# Patient Record
Sex: Male | Born: 1975 | Race: White | Hispanic: No | Marital: Married | Smoking: Former smoker
Health system: Southern US, Community
[De-identification: ages and names within clinical notes are randomized; demographics above are authoritative.]

## PROBLEM LIST (undated history)

## (undated) DIAGNOSIS — R51 Headache: Secondary | ICD-10-CM

## (undated) DIAGNOSIS — R519 Headache, unspecified: Secondary | ICD-10-CM

## (undated) DIAGNOSIS — T4145XA Adverse effect of unspecified anesthetic, initial encounter: Secondary | ICD-10-CM

## (undated) DIAGNOSIS — K4090 Unilateral inguinal hernia, without obstruction or gangrene, not specified as recurrent: Secondary | ICD-10-CM

## (undated) DIAGNOSIS — T8859XA Other complications of anesthesia, initial encounter: Secondary | ICD-10-CM

---
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
## 1998-01-04 HISTORY — PX: HERNIA REPAIR: SHX51

## 2016-08-18 ENCOUNTER — Other Ambulatory Visit: Payer: Self-pay | Admitting: General Surgery

## 2016-08-18 DIAGNOSIS — R1032 Left lower quadrant pain: Secondary | ICD-10-CM

## 2016-08-24 ENCOUNTER — Other Ambulatory Visit: Payer: Self-pay

## 2016-08-26 ENCOUNTER — Ambulatory Visit
Admission: RE | Admit: 2016-08-26 | Discharge: 2016-08-26 | Disposition: A | Discharge status: Home / Self Care | Payer: BLUE CROSS/BLUE SHIELD | Source: Ambulatory Visit | Attending: General Surgery | Admitting: General Surgery

## 2016-08-26 DIAGNOSIS — R1032 Left lower quadrant pain: Secondary | ICD-10-CM

## 2016-08-26 MED ORDER — IOPAMIDOL (ISOVUE-300) INJECTION 61%
125.0000 mL | Freq: Once | INTRAVENOUS | Status: AC | PRN
  Administered 2016-08-26: 125 mL via INTRAVENOUS

## 2016-09-04 HISTORY — PX: OTHER SURGICAL HISTORY: SHX169

## 2016-09-17 ENCOUNTER — Ambulatory Visit: Payer: Self-pay | Admitting: General Surgery

## 2016-10-13 ENCOUNTER — Encounter (HOSPITAL_BASED_OUTPATIENT_CLINIC_OR_DEPARTMENT_OTHER): Payer: Self-pay | Admitting: *Deleted

## 2016-10-13 NOTE — Progress Notes (Signed)
Npo after midnight arrive 800 10-21-16 [facility], wife Alex Hicks driver.

## 2016-10-18 DIAGNOSIS — K4090 Unilateral inguinal hernia, without obstruction or gangrene, not specified as recurrent: Secondary | ICD-10-CM | POA: Diagnosis not present

## 2016-10-18 DIAGNOSIS — Z87891 Personal history of nicotine dependence: Secondary | ICD-10-CM | POA: Diagnosis not present

## 2016-10-18 DIAGNOSIS — K4091 Unilateral inguinal hernia, without obstruction or gangrene, recurrent: Secondary | ICD-10-CM | POA: Diagnosis present

## 2016-10-18 LAB — BASIC METABOLIC PANEL
Anion gap: 10 (ref 5–15)
BUN: 13 mg/dL (ref 6–20)
CHLORIDE: 102 mmol/L (ref 101–111)
CO2: 27 mmol/L (ref 22–32)
Calcium: 9.6 mg/dL (ref 8.9–10.3)
Creatinine, Ser: 0.97 mg/dL (ref 0.61–1.24)
GFR calc non Af Amer: >60 mL/min (ref >60)
Glucose, Bld: 101 mg/dL — ABNORMAL HIGH (ref 65–99)
POTASSIUM: 4.5 mmol/L (ref 3.5–5.1)
SODIUM: 139 mmol/L (ref 135–145)

## 2016-10-18 LAB — CBC WITH DIFFERENTIAL/PLATELET
Basophils Absolute: 0 10*3/uL (ref 0.0–0.1)
Basophils Relative: 1 %
Eosinophils Absolute: 0.2 10*3/uL (ref 0.0–0.7)
Eosinophils Relative: 4 %
HEMATOCRIT: 49.8 % (ref 39.0–52.0)
HEMOGLOBIN: 17.5 g/dL — AB (ref 13.0–17.0)
LYMPHS ABS: 2.6 10*3/uL (ref 0.7–4.0)
LYMPHS PCT: 39 %
MCH: 32.6 pg (ref 26.0–34.0)
MCHC: 35.1 g/dL (ref 30.0–36.0)
MCV: 92.9 fL (ref 78.0–100.0)
Monocytes Absolute: 0.6 10*3/uL (ref 0.1–1.0)
Monocytes Relative: 9 %
NEUTROS ABS: 3.2 10*3/uL (ref 1.7–7.7)
NEUTROS PCT: 47 %
Platelets: 227 10*3/uL (ref 150–400)
RBC: 5.36 MIL/uL (ref 4.22–5.81)
RDW: 12.6 % (ref 11.5–15.5)
WBC: 6.7 10*3/uL (ref 4.0–10.5)

## 2016-10-21 ENCOUNTER — Ambulatory Visit (HOSPITAL_BASED_OUTPATIENT_CLINIC_OR_DEPARTMENT_OTHER): Payer: BLUE CROSS/BLUE SHIELD | Admitting: Anesthesiology

## 2016-10-21 ENCOUNTER — Ambulatory Visit (HOSPITAL_BASED_OUTPATIENT_CLINIC_OR_DEPARTMENT_OTHER)
Admission: RE | Admit: 2016-10-21 | Discharge: 2016-10-21 | Disposition: A | Discharge status: Home / Self Care | Payer: BLUE CROSS/BLUE SHIELD | Source: Ambulatory Visit | Attending: General Surgery | Admitting: General Surgery

## 2016-10-21 ENCOUNTER — Encounter (HOSPITAL_BASED_OUTPATIENT_CLINIC_OR_DEPARTMENT_OTHER)
Admission: RE | Disposition: A | Discharge status: Home / Self Care | Payer: Self-pay | Source: Ambulatory Visit | Attending: General Surgery

## 2016-10-21 ENCOUNTER — Encounter (HOSPITAL_BASED_OUTPATIENT_CLINIC_OR_DEPARTMENT_OTHER): Payer: Self-pay | Admitting: Anesthesiology

## 2016-10-21 DIAGNOSIS — Z87891 Personal history of nicotine dependence: Secondary | ICD-10-CM | POA: Insufficient documentation

## 2016-10-21 DIAGNOSIS — K4091 Unilateral inguinal hernia, without obstruction or gangrene, recurrent: Secondary | ICD-10-CM | POA: Diagnosis not present

## 2016-10-21 DIAGNOSIS — K4090 Unilateral inguinal hernia, without obstruction or gangrene, not specified as recurrent: Secondary | ICD-10-CM | POA: Insufficient documentation

## 2016-10-21 HISTORY — DX: Headache, unspecified: R51.9

## 2016-10-21 HISTORY — PX: INSERTION OF MESH: SHX5868

## 2016-10-21 HISTORY — DX: Unilateral inguinal hernia, without obstruction or gangrene, not specified as recurrent: K40.90

## 2016-10-21 HISTORY — DX: Adverse effect of unspecified anesthetic, initial encounter: T41.45XA

## 2016-10-21 HISTORY — DX: Other complications of anesthesia, initial encounter: T88.59XA

## 2016-10-21 HISTORY — DX: Headache: R51

## 2016-10-21 HISTORY — PX: INGUINAL HERNIA REPAIR: SHX194

## 2016-10-21 SURGERY — REPAIR, HERNIA, INGUINAL, LAPAROSCOPIC
Anesthesia: General

## 2016-10-21 MED ORDER — DEXAMETHASONE SODIUM PHOSPHATE 10 MG/ML IJ SOLN
INTRAMUSCULAR | Status: AC
  Filled 2016-10-21: qty 1

## 2016-10-21 MED ORDER — SUGAMMADEX SODIUM 200 MG/2ML IV SOLN
INTRAVENOUS | Status: AC
  Filled 2016-10-21: qty 2

## 2016-10-21 MED ORDER — ONDANSETRON HCL 4 MG/2ML IJ SOLN
4.0000 mg | Freq: Once | INTRAMUSCULAR | Status: AC | PRN
  Administered 2016-10-21: 4 mg via INTRAVENOUS
  Filled 2016-10-21: qty 2

## 2016-10-21 MED ORDER — PROPOFOL 10 MG/ML IV BOLUS
INTRAVENOUS | Status: AC
Start: 2016-10-21 — End: ?
  Filled 2016-10-21: qty 20

## 2016-10-21 MED ORDER — CEFAZOLIN SODIUM-DEXTROSE 2-4 GM/100ML-% IV SOLN
INTRAVENOUS | Status: AC
  Filled 2016-10-21: qty 100

## 2016-10-21 MED ORDER — SUGAMMADEX SODIUM 200 MG/2ML IV SOLN
INTRAVENOUS | Status: DC | PRN
  Administered 2016-10-21: 200 mg via INTRAVENOUS

## 2016-10-21 MED ORDER — FENTANYL CITRATE (PF) 100 MCG/2ML IJ SOLN
INTRAMUSCULAR | Status: DC | PRN
  Administered 2016-10-21 (×2): 25 ug via INTRAVENOUS
  Administered 2016-10-21: 50 ug via INTRAVENOUS
  Administered 2016-10-21 (×4): 25 ug via INTRAVENOUS
  Administered 2016-10-21 (×2): 50 ug via INTRAVENOUS

## 2016-10-21 MED ORDER — MIDAZOLAM HCL 5 MG/5ML IJ SOLN
INTRAMUSCULAR | Status: DC | PRN
  Administered 2016-10-21: 2 mg via INTRAVENOUS

## 2016-10-21 MED ORDER — ACETAMINOPHEN 500 MG PO TABS
ORAL_TABLET | ORAL | Status: AC
Start: 2016-10-21 — End: ?
  Filled 2016-10-21: qty 2

## 2016-10-21 MED ORDER — ONDANSETRON HCL 4 MG/2ML IJ SOLN
INTRAMUSCULAR | Status: DC | PRN
  Administered 2016-10-21: 4 mg via INTRAVENOUS

## 2016-10-21 MED ORDER — CHLORHEXIDINE GLUCONATE CLOTH 2 % EX PADS
6.0000 | MEDICATED_PAD | Freq: Once | CUTANEOUS | Status: DC
  Filled 2016-10-21: qty 6

## 2016-10-21 MED ORDER — ACETAMINOPHEN 500 MG PO TABS
1000.0000 mg | ORAL_TABLET | ORAL | Status: AC
  Administered 2016-10-21: 1000 mg via ORAL
  Filled 2016-10-21: qty 2

## 2016-10-21 MED ORDER — FENTANYL CITRATE (PF) 100 MCG/2ML IJ SOLN
INTRAMUSCULAR | Status: AC
  Filled 2016-10-21: qty 2

## 2016-10-21 MED ORDER — ONDANSETRON HCL 4 MG/2ML IJ SOLN
INTRAMUSCULAR | Status: AC
Start: 2016-10-21 — End: ?
  Filled 2016-10-21: qty 2

## 2016-10-21 MED ORDER — ROCURONIUM BROMIDE 100 MG/10ML IV SOLN
INTRAVENOUS | Status: DC | PRN
  Administered 2016-10-21 (×3): 10 mg via INTRAVENOUS
  Administered 2016-10-21: 50 mg via INTRAVENOUS

## 2016-10-21 MED ORDER — GABAPENTIN 300 MG PO CAPS
ORAL_CAPSULE | ORAL | Status: AC
  Filled 2016-10-21: qty 1

## 2016-10-21 MED ORDER — MEPERIDINE HCL 25 MG/ML IJ SOLN
6.2500 mg | INTRAMUSCULAR | Status: DC | PRN
  Filled 2016-10-21: qty 1

## 2016-10-21 MED ORDER — LACTATED RINGERS IV SOLN
INTRAVENOUS | Status: DC
  Administered 2016-10-21 (×3): via INTRAVENOUS
  Filled 2016-10-21: qty 1000

## 2016-10-21 MED ORDER — FENTANYL CITRATE (PF) 100 MCG/2ML IJ SOLN
25.0000 ug | INTRAMUSCULAR | Status: DC | PRN
  Filled 2016-10-21: qty 1

## 2016-10-21 MED ORDER — CELECOXIB 200 MG PO CAPS
ORAL_CAPSULE | ORAL | Status: AC
  Filled 2016-10-21: qty 1

## 2016-10-21 MED ORDER — MIDAZOLAM HCL 2 MG/2ML IJ SOLN
INTRAMUSCULAR | Status: AC
  Filled 2016-10-21: qty 2

## 2016-10-21 MED ORDER — ROCURONIUM BROMIDE 50 MG/5ML IV SOSY
PREFILLED_SYRINGE | INTRAVENOUS | Status: AC
  Filled 2016-10-21: qty 5

## 2016-10-21 MED ORDER — CEFAZOLIN SODIUM-DEXTROSE 2-4 GM/100ML-% IV SOLN
2.0000 g | INTRAVENOUS | Status: AC
  Administered 2016-10-21: 2 g via INTRAVENOUS
  Filled 2016-10-21: qty 100

## 2016-10-21 MED ORDER — HYDROCODONE-ACETAMINOPHEN 5-325 MG PO TABS: 1.0000 | ORAL_TABLET | Freq: Four times a day (QID) | ORAL | 0 refills | Status: AC | PRN

## 2016-10-21 MED ORDER — PROMETHAZINE HCL 25 MG/ML IJ SOLN
INTRAMUSCULAR | Status: AC
  Filled 2016-10-21: qty 1

## 2016-10-21 MED ORDER — GABAPENTIN 300 MG PO CAPS
300.0000 mg | ORAL_CAPSULE | ORAL | Status: AC
  Administered 2016-10-21: 300 mg via ORAL
  Filled 2016-10-21: qty 1

## 2016-10-21 MED ORDER — LIDOCAINE HCL (CARDIAC) 20 MG/ML IV SOLN
INTRAVENOUS | Status: DC | PRN
  Administered 2016-10-21: 100 mg via INTRAVENOUS

## 2016-10-21 MED ORDER — DEXAMETHASONE SODIUM PHOSPHATE 4 MG/ML IJ SOLN
INTRAMUSCULAR | Status: DC | PRN
  Administered 2016-10-21: 10 mg via INTRAVENOUS

## 2016-10-21 MED ORDER — LIDOCAINE 2% (20 MG/ML) 5 ML SYRINGE
INTRAMUSCULAR | Status: AC
  Filled 2016-10-21: qty 5

## 2016-10-21 MED ORDER — BUPIVACAINE HCL 0.5 % IJ SOLN
INTRAMUSCULAR | Status: DC | PRN
  Administered 2016-10-21: 30 mL

## 2016-10-21 MED ORDER — BUPIVACAINE LIPOSOME 1.3 % IJ SUSP
INTRAMUSCULAR | Status: DC | PRN
  Administered 2016-10-21: 20 mL

## 2016-10-21 MED ORDER — KETOROLAC TROMETHAMINE 30 MG/ML IJ SOLN
INTRAMUSCULAR | Status: AC
  Filled 2016-10-21: qty 1

## 2016-10-21 MED ORDER — PROMETHAZINE HCL 25 MG/ML IJ SOLN
6.2500 mg | INTRAMUSCULAR | Status: DC | PRN
Start: 2016-10-21 — End: 2016-10-21
  Administered 2016-10-21: 6.25 mg via INTRAVENOUS
  Filled 2016-10-21: qty 1

## 2016-10-21 MED ORDER — IBUPROFEN 800 MG PO TABS: 800.0000 mg | ORAL_TABLET | Freq: Three times a day (TID) | ORAL | 0 refills | Status: AC | PRN

## 2016-10-21 MED ORDER — CELECOXIB 200 MG PO CAPS
200.0000 mg | ORAL_CAPSULE | ORAL | Status: AC
  Administered 2016-10-21: 200 mg via ORAL
  Filled 2016-10-21: qty 1

## 2016-10-21 MED ORDER — PROPOFOL 10 MG/ML IV BOLUS
INTRAVENOUS | Status: DC | PRN
  Administered 2016-10-21: 100 mg via INTRAVENOUS
  Administered 2016-10-21 (×2): 50 mg via INTRAVENOUS
  Administered 2016-10-21: 200 mg via INTRAVENOUS

## 2016-10-21 SURGICAL SUPPLY — 40 items
APPLICATOR COTTON TIP 6IN STRL (MISCELLANEOUS) ×3 IMPLANT
BLADE CLIPPER SENSICLIP SURGIC (BLADE) ×3 IMPLANT
BLADE SURG 11 STRL SS (BLADE) ×3 IMPLANT
CABLE HIGH FREQUENCY MONO STRZ (ELECTRODE) ×3 IMPLANT
CHLORAPREP W/TINT 26ML (MISCELLANEOUS) ×3 IMPLANT
COVER BACK TABLE 60X90IN (DRAPES) ×3 IMPLANT
COVER MAYO STAND STRL (DRAPES) ×3 IMPLANT
DECANTER SPIKE VIAL GLASS SM (MISCELLANEOUS) IMPLANT
DERMABOND ADVANCED (GAUZE/BANDAGES/DRESSINGS) ×6
DERMABOND ADVANCED .7 DNX12 (GAUZE/BANDAGES/DRESSINGS) ×3 IMPLANT
DEVICE PMI PUNCTURE CLOSURE (MISCELLANEOUS) ×3 IMPLANT
DEVICE SECURE STRAP 25 ABSORB (INSTRUMENTS) ×3 IMPLANT
DRAPE LAPAROSCOPIC ABDOMINAL (DRAPES) ×3 IMPLANT
DRAPE UTILITY XL STRL (DRAPES) ×3 IMPLANT
ELECT REM PT RETURN 9FT ADLT (ELECTROSURGICAL) ×3
ELECTRODE REM PT RTRN 9FT ADLT (ELECTROSURGICAL) ×1 IMPLANT
GLOVE BIOGEL PI IND STRL 7.0 (GLOVE) ×1 IMPLANT
GLOVE BIOGEL PI INDICATOR 7.0 (GLOVE) ×2
GLOVE SURG SS PI 7.0 STRL IVOR (GLOVE) ×3 IMPLANT
GOWN STRL REUS W/TWL LRG LVL3 (GOWN DISPOSABLE) ×3 IMPLANT
IRRIG SUCT STRYKERFLOW 2 WTIP (MISCELLANEOUS) ×3
IRRIGATION SUCT STRKRFLW 2 WTP (MISCELLANEOUS) ×1 IMPLANT
MESH 3DMAX LIGHT 4.8X6.7 LT XL (Mesh General) ×3 IMPLANT
MESH 3DMAX LIGHT 4.8X6.7 RT XL (Mesh General) ×3 IMPLANT
NEEDLE HYPO 25X1 1.5 SAFETY (NEEDLE) ×3 IMPLANT
PACK BASIN DAY SURGERY FS (CUSTOM PROCEDURE TRAY) ×3 IMPLANT
SCISSORS LAP 5X35 DISP (ENDOMECHANICALS) IMPLANT
SHEARS HARMONIC ACE PLUS 36CM (ENDOMECHANICALS) ×3 IMPLANT
SOLUTION ANTI FOG 6CC (MISCELLANEOUS) ×3 IMPLANT
SUT MNCRL AB 4-0 PS2 18 (SUTURE) ×3 IMPLANT
SUT VIC AB 2-0 SH 27 (SUTURE)
SUT VIC AB 2-0 SH 27X BRD (SUTURE) IMPLANT
SUT VICRYL 0 UR6 27IN ABS (SUTURE) ×3 IMPLANT
SUT VLOC BARB 180 ABS3/0GR12 (SUTURE) ×9
SUTURE VLOC BRB 180 ABS3/0GR12 (SUTURE) ×3 IMPLANT
SYR CONTROL 10ML LL (SYRINGE) ×3 IMPLANT
TOWEL OR 17X24 6PK STRL BLUE (TOWEL DISPOSABLE) ×3 IMPLANT
TROCAR CANNULA W/PORT DUAL 5MM (MISCELLANEOUS) ×3 IMPLANT
TROCAR XCEL 12X100 BLDLESS (ENDOMECHANICALS) ×3 IMPLANT
TUBING INSUF HEATED (TUBING) ×3 IMPLANT

## 2016-10-21 NOTE — Discharge Instructions (Signed)

## 2016-10-21 NOTE — Anesthesia Procedure Notes (Signed)
Procedure Name: Intubation Date/Time: 10/21/2016 10:19 AM Performed by: Justice Rocher Pre-anesthesia Checklist: Patient identified, Emergency Drugs available, Suction available, Patient being monitored and Timeout performed Patient Re-evaluated:Patient Re-evaluated prior to induction Oxygen Delivery Method: Circle system utilized Preoxygenation: Pre-oxygenation with 100% oxygen Induction Type: IV induction Ventilation: Mask ventilation without difficulty Laryngoscope Size: Mac and 4 Grade View: Grade IV Tube type: Oral Tube size: 7.5 mm Number of attempts: 3 Airway Equipment and Method: Video-laryngoscopy Placement Confirmation: breath sounds checked- equal and bilateral and positive ETCO2 Secured at: 23 cm Tube secured with: Tape Dental Injury: Teeth and Oropharynx as per pre-operative assessment  Difficulty Due To: Difficulty was unanticipated Comments: See note by Dr Ambrose Pancoast MDA

## 2016-10-21 NOTE — Anesthesia Procedure Notes (Signed)
Performed by: Declan Mier C       

## 2016-10-21 NOTE — H&P (Signed)
Alex Hicks is an 41 y.o. male.   Alex Alex MemoryComplaint: hernia HPI: 41 yo male with history of left inguinal hernia repair began having pain in his left groin and on exam was found to have a right hernia as well. He presents for laparoscopic repair  Past Medical History:  Diagnosis Date  . Complication of anesthesia    nausea after  . Headache    3 migraines /year  . Inguinal hernia     Past Surgical History:  Procedure Laterality Date  . colonscopy  09/2016  . eye socket repair Right 1993  . HERNIA REPAIR  2000   inguinal   . TONSILLECTOMY  1992  . WISDOM TOOTH EXTRACTION  1994    History reviewed. No pertinent family history. Social History:  reports that he quit smoking about 10 years ago. He has a 10.00 pack-year smoking history. He has never used smokeless tobacco. He reports that he drinks alcohol. He reports that he does not use drugs.  Allergies: No Known Allergies  Medications Prior to Admission  Medication Sig Dispense Refill  . acetaminophen (TYLENOL) 325 MG tablet Take 650 mg by mouth every 6 (six) hours as needed.    Marland Kitchen. ibuprofen (ADVIL,MOTRIN) 100 MG tablet Take 200 mg by mouth every 6 (six) hours as needed for fever.      No results found for this or any previous visit (from the past 48 hour(s)). No results found.  Review of Systems  Constitutional: Negative for chills and fever.  HENT: Negative for hearing loss.   Eyes: Negative for blurred vision and double vision.  Respiratory: Negative for cough and hemoptysis.   Cardiovascular: Negative for chest pain and palpitations.  Gastrointestinal: Negative for abdominal pain, nausea and vomiting.  Genitourinary: Negative for dysuria and urgency.  Musculoskeletal: Negative for myalgias and neck pain.  Skin: Negative for itching and rash.  Neurological: Negative for dizziness, tingling and headaches.  Endo/Heme/Allergies: Does not bruise/bleed easily.  Psychiatric/Behavioral: Negative for depression and suicidal  ideas.    Blood pressure (!) 142/82, pulse 73, temperature 98.2 F (36.8 C), temperature source Oral, resp. rate 16, height 5\' 9"  (1.753 m), weight 98 kg (216 lb), SpO2 99 %. Physical Exam  Vitals reviewed. Constitutional: He is oriented to person, place, and time. He appears well-developed and well-nourished.  HENT:  Head: Normocephalic and atraumatic.  Eyes: Pupils are equal, round, and reactive to light. Conjunctivae and EOM are normal.  Neck: Normal range of motion. Neck supple.  Cardiovascular: Normal rate and regular rhythm.   Respiratory: Effort normal and breath sounds normal.  GI: Soft. Bowel sounds are normal. He exhibits no distension. There is no tenderness.  Small right inguinal hernia, moderate left inguinal hernia  Musculoskeletal: Normal range of motion.  Neurological: He is alert and oriented to person, place, and time.  Skin: Skin is warm and dry.  Psychiatric: He has a normal mood and affect. His behavior is normal.     Assessment/Plan 41 yo male with recurrent left inguinal hernia and new right inguinal hernia -lap inguinal hernia repair with mesh -planned outpatient procedure -[facility]  Rodman PickleLuke Aaron Kinsinger, MD 10/21/2016, 9:32 AM

## 2016-10-21 NOTE — Transfer of Care (Signed)
  Last Vitals:  Vitals:   10/21/16 0759 10/21/16 1308  BP: (!) 142/82 133/87  Pulse: 73 93  Resp: 16 12  Temp: 36.8 C 36.5 C  SpO2: 99% 100%    Last Pain:  Vitals:   10/21/16 0759  TempSrc: Oral      Patients Stated Pain Goal: 6 (10/21/16 [id number])  Immediate Anesthesia Transfer of Care Note  Patient: Alex Hicks  Procedure(s) Performed: Procedure(s) (LRB): LAPAROSCOPIC LEFT INGUINAL HERNIA REPAIR WITH MESH, LAPAROSCOPIC RIGHT INGUINAL HERNIA REPAIR WITH MESH (N/A) INSERTION OF MESH (N/A)  Patient Location: PACU  Anesthesia Type: General  Level of Consciousness: awake, alert  and oriented  Airway & Oxygen Therapy: Patient Spontanous Breathing and Patient connected to face mask oxygen  Post-op Assessment: Report given to PACU RN and Post -op Vital signs reviewed and stable  Post vital signs: Reviewed and stable  Complications: No apparent anesthesia complications

## 2016-10-21 NOTE — Anesthesia Preprocedure Evaluation (Signed)
Anesthesia Evaluation  Patient identified by MRN, date of birth, ID band Patient awake    Reviewed: Allergy & Precautions, NPO status , Patient's Chart, lab work & pertinent test results  Airway Mallampati: II  TM Distance: >3 FB Neck ROM: Full    Dental no notable dental hx.    Pulmonary neg pulmonary ROS, former smoker,    Pulmonary exam normal breath sounds clear to auscultation       Cardiovascular negative cardio ROS Normal cardiovascular exam Rhythm:Regular Rate:Normal     Neuro/Psych  Headaches, negative neurological ROS  negative psych ROS   GI/Hepatic negative GI ROS, Neg liver ROS,   Endo/Other  negative endocrine ROS  Renal/GU negative Renal ROS  negative genitourinary   Musculoskeletal negative musculoskeletal ROS (+)   Abdominal   Peds negative pediatric ROS (+)  Hematology negative hematology ROS (+)   Anesthesia Other Findings   Reproductive/Obstetrics negative OB ROS                             Anesthesia Physical Anesthesia Plan  ASA: I  Anesthesia Plan: General   Post-op Pain Management:    Induction: Intravenous  PONV Risk Score and Plan: 2 and Ondansetron, Dexamethasone and Treatment may vary due to age or medical condition  Airway Management Planned: Oral ETT  Additional Equipment:   Intra-op Plan:   Post-operative Plan: Extubation in OR  Informed Consent:   Plan Discussed with:   Anesthesia Plan Comments: (  )        Anesthesia Quick Evaluation

## 2016-10-21 NOTE — Anesthesia Postprocedure Evaluation (Signed)
Anesthesia Post Note  Patient: Alex Hicks  Procedure(s) Performed: LAPAROSCOPIC LEFT INGUINAL HERNIA REPAIR WITH MESH, LAPAROSCOPIC RIGHT INGUINAL HERNIA REPAIR WITH MESH (N/A ) INSERTION OF MESH (N/A )     Patient location during evaluation: PACU Anesthesia Type: General Level of consciousness: sedated Pain management: pain level controlled Vital Signs Assessment: post-procedure vital signs reviewed and stable Respiratory status: spontaneous breathing and respiratory function stable Cardiovascular status: stable Postop Assessment: no apparent nausea or vomiting Anesthetic complications: no    Last Vitals:  Vitals:   10/21/16 1345 10/21/16 1400  BP: 112/69 113/71  Pulse: 86 85  Resp: 12 11  Temp:    SpO2: 94% 99%    Last Pain:  Vitals:   10/21/16 1400  TempSrc:   PainSc: 4                  Rosibel Giacobbe DANIEL

## 2016-10-21 NOTE — Op Note (Signed)
Preop diagnosis: left recurrent inguinal hernia, right inguinal hernia  Postop diagnosis: left recurrent inguinal hernia, right indirect inguinal hernia  Procedure: laparoscopic recurrent left inguinal hernia repair with mesh, laparoscopic right inguinal hernia repair with mesh  Surgeon: Feliciana Rossetti, M.D.  Asst: none  Anesthesia: Gen.   Indications for procedure: Alex Hicks is a 41 y.o. male with symptoms of pain and enlarging Bilateral inguinal hernia(s). After discussing risks, alternatives and benefits he decided on laparoscopic repair and was brought to day surgery for repair.  Description of procedure: The patient was brought into the operative suite, placed supine. Anesthesia was administered with endotracheal tube. Patient was strapped in place. The patient was prepped and draped in the usual sterile fashion.  Next quarter percent Marcaine was injected to the Left of the umbilicus, and a transverse 2 cm incision was made. Dissection was used to visualize the anterior rectus sheath. Anterior rectus sheath was sharply incised, next to the 12 mm trocar was inserted into the preperitoneal space. Laparoscope was inserted and found that the peritoneum had a small hole. Therefore, decision was made to convert to a TAP type repair. 1 5mm trocar was placed in the left lower quadrant and 1 5mm trocar was placed in the right lower quadrant. Next the peritoneum was incised with harmonic scalpel and blunt dissection was used to create a peritoneal flap by freeing it away from the rectus muscles.  On initial visualization , there were multiple adhesions in the left groin and there was an obvious defect in the indirect space on the right. Dissection began on the right. Next a began our dissection identifying the ASIS laterally and then working back medially removing the filmy tissue and adhesions of the peritoneum to the abdominal wall. Hernia sac was completely dissected out of the canal. Vas  deference and contents of the cord were safely dissected away of the hernia sac. The peritoneum had been very adherent to cord structures and on dissection the sac was entered.  On the left, we began our dissection identifying the ASIS laterally and then working back medially removing the filmy tissue and adhesions of the peritoneum to the abdominal wall. Hernia sac was completely dissected out of the canal. Vas deference and contents of the cord were safely dissected away of the hernia sac. The hernia sac and cord structures had a moderate amount of scar tissue and were difficult to discern. At one point I thought I was beyond the hernia sac and cut through tissue to find that I had entered the hernia sac. The peritoneum hole was closed with 2 3-0 v-loc suture. On reexamining the area the inferior portion of the hernia sac was the sigmoid colon in a sliding type hernia.  A 3D max extra large left light weight mesh was inserted and tacked medially to the lacunar ligament. It was also tacked laterally and superiorly. The mesh was positioned flat and directly up against the direct and indirect areas.   Next, the right side peritoneal hole was closed with a 3-0 v-loc suture in running fashion. A 3D max extra large right light weight mesh was inserted and tacked medially to the lacunar ligament. It was also tacked laterally and superiorly. The mesh was positioned flat and directly up against the direct and indirect areas.  The remainder of the Exparel mix was infused along the line of the inguinal ligament deep to the fascia.   The peritoneal flap was re-approximated to the abdominal cavity with absorbable tacks. The  CO2 was evacuated while watching to ensure the mesh did not migrate.. The anterior rectus fascia was closed with 0 vicryl in interrupted sutures and all skin incisions were closed with 4-0 monocryl subcu stitch. The patient awoke from anesthesia and was brought to PACU in stable  condition.  Findings: recurrent left inguinal hernia, sliding type, indirect right inguinal hernia  Specimen: none  Blood loss: 50 ml  Local anesthesia: 50 ml Exparel:Marcaine mix  Complications: none  Implant: extra large right and left Bard 3D max light mesh  Feliciana RossettiLuke Pravin Perezperez, M.D. General, Bariatric, & Minimally Invasive Surgery [facility], [place] 12:51 PM 10/21/2016

## 2016-10-22 ENCOUNTER — Encounter (HOSPITAL_BASED_OUTPATIENT_CLINIC_OR_DEPARTMENT_OTHER): Payer: Self-pay | Admitting: General Surgery

## 2018-05-24 IMAGING — CT CT ABD-PELV W/ CM
3 of 5 series · 13 of 36 positions shown, 19 images · IV contrast (READICAT/WATER & [ID] ISOVUE 300)
Comparison: None.

CLINICAL DATA: Left inguinal pain for several months, initial
encounter

EXAM:
CT ABDOMEN AND PELVIS WITH CONTRAST
TECHNIQUE: Multidetector CT imaging of the abdomen and pelvis was performed
using the standard protocol following bolus administration of
intravenous contrast.
CONTRAST:  100 mL Isovue 370

[Series 3: abd/pelvis with · axial · 0.86mm/px · z∈[-405,-70]mm · 7 of 91 slices shown, 12 images]
[im 12/91  soft-tissue]
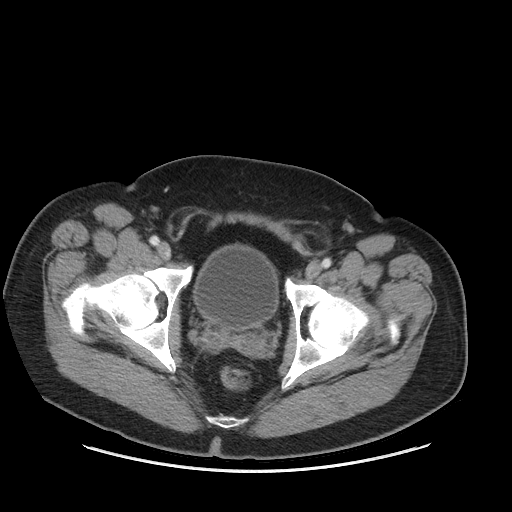
[im 12/91  bone]
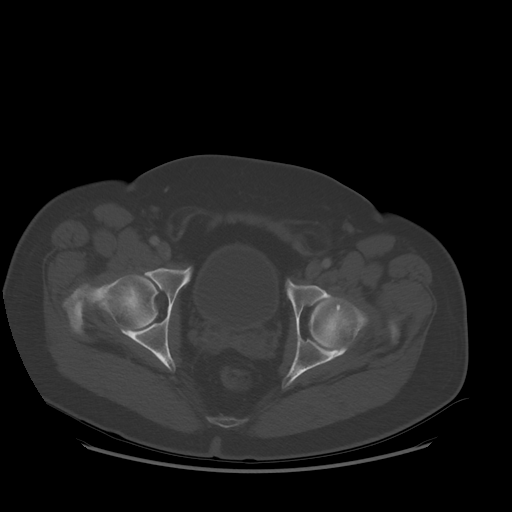
[im 23/91  soft-tissue]
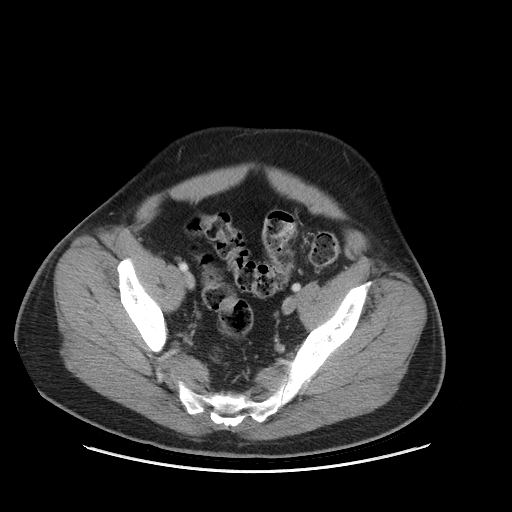
[im 34/91  soft-tissue]
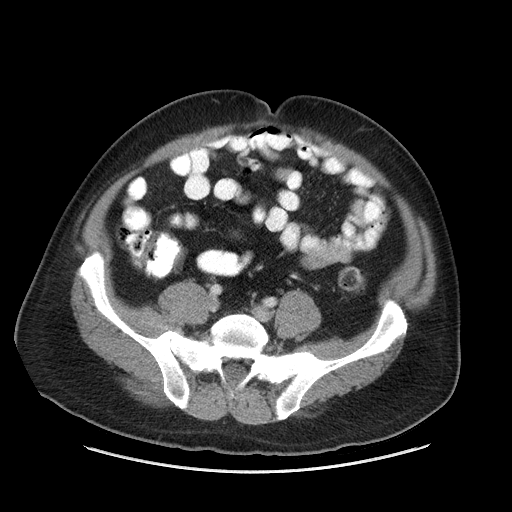
[im 46/91  soft-tissue]
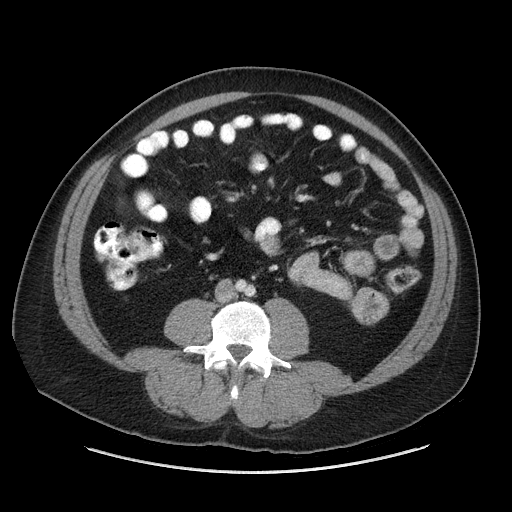
[im 46/91  lung]
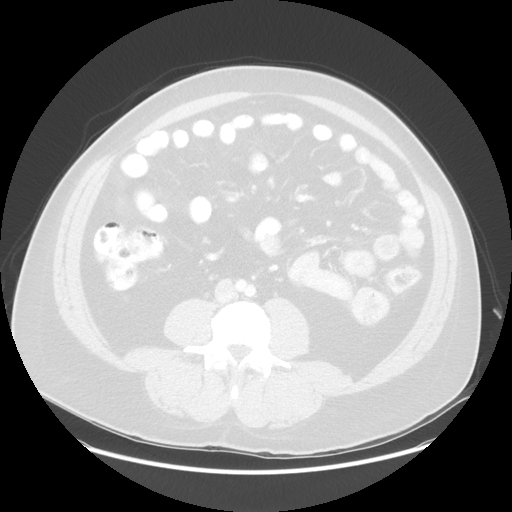
[im 57/91  soft-tissue]
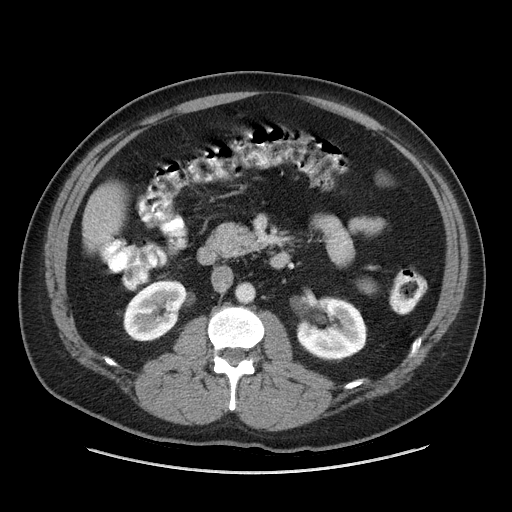
[im 57/91  lung]
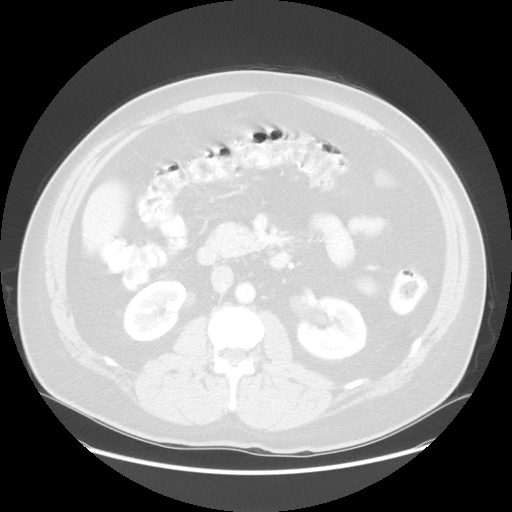
[im 68/91  soft-tissue]
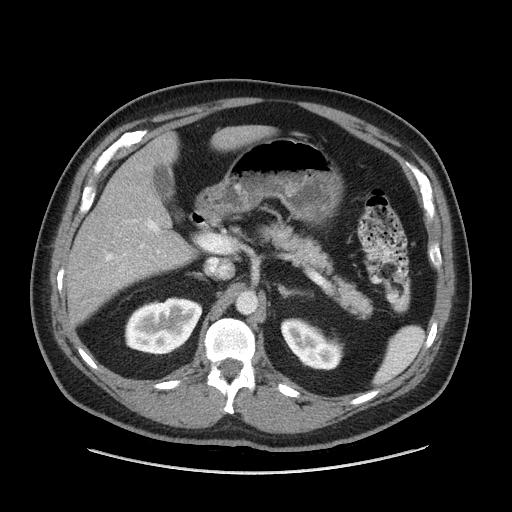
[im 68/91  lung]
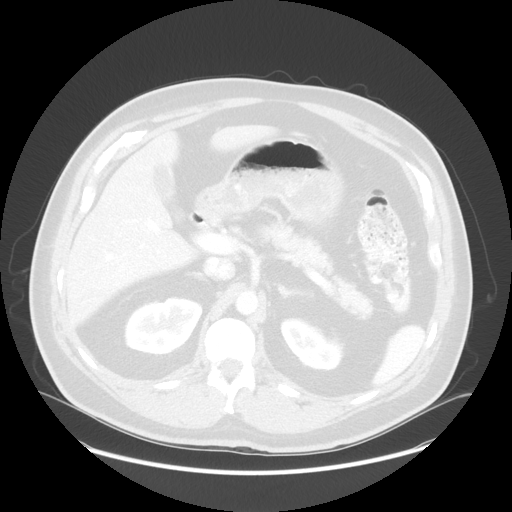
[im 79/91  soft-tissue]
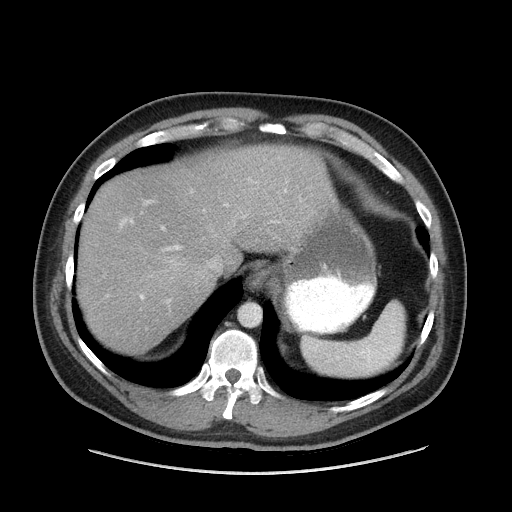
[im 79/91  lung]
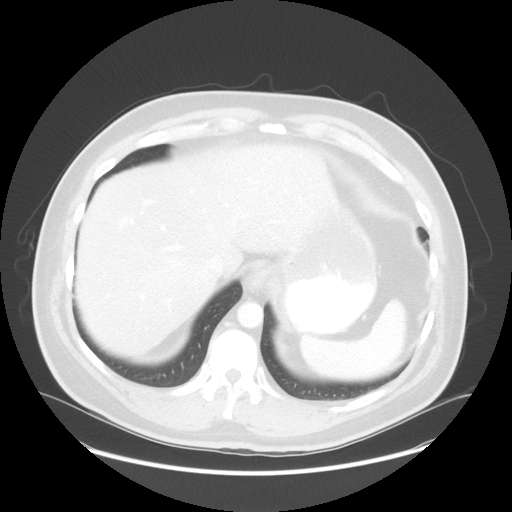

[Series 601: coronal body · coronal · 0.96mm/px · 1 of 140 slices shown, 2 images]
[im 47/140  soft-tissue]
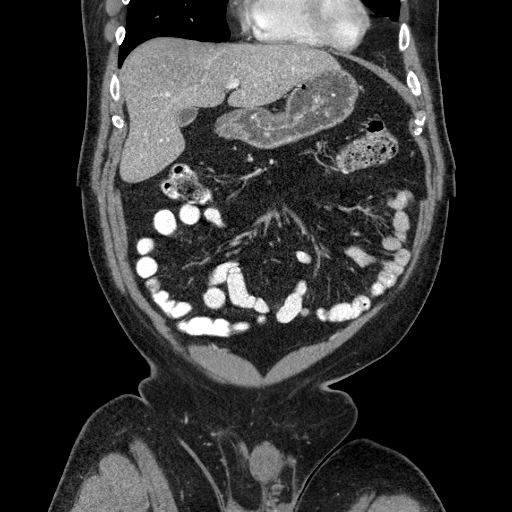
[im 47/140  bone]
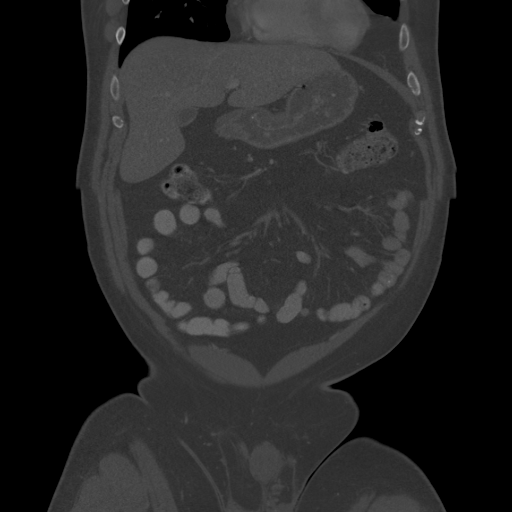

[Series 602: sagittal body · sagittal · 0.96mm/px · 5 of 169 slices shown]
[im 11/169  soft-tissue]
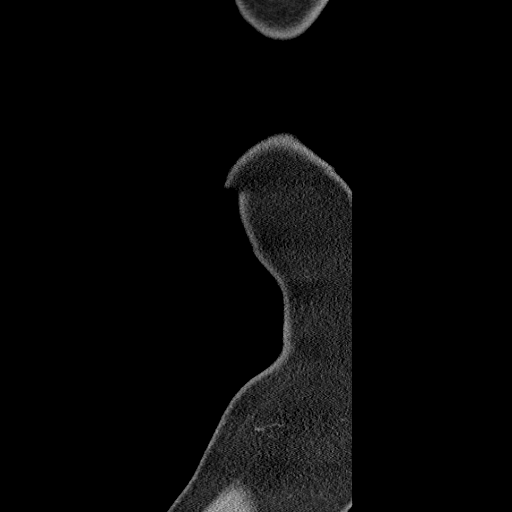
[im 32/169  soft-tissue]
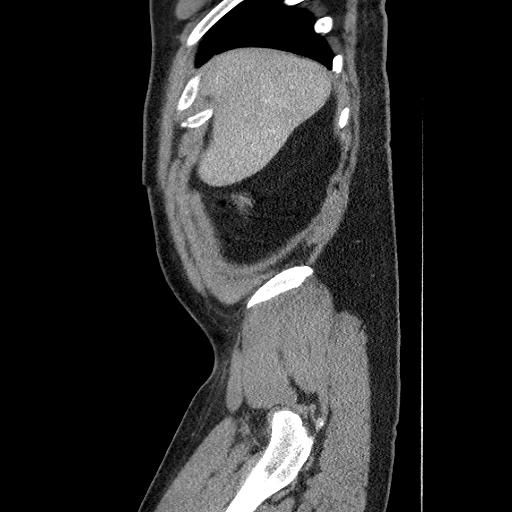
[im 53/169  soft-tissue]
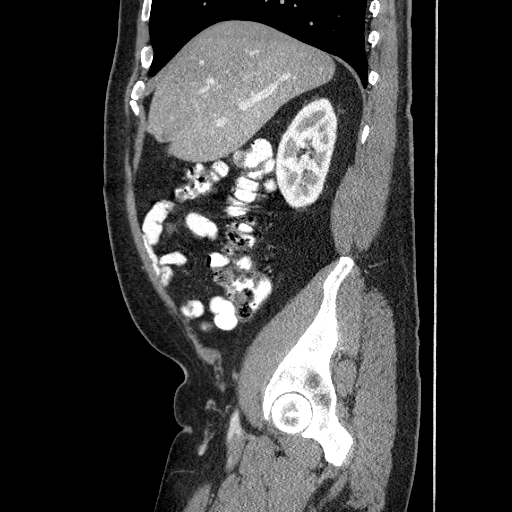
[im 74/169  soft-tissue]
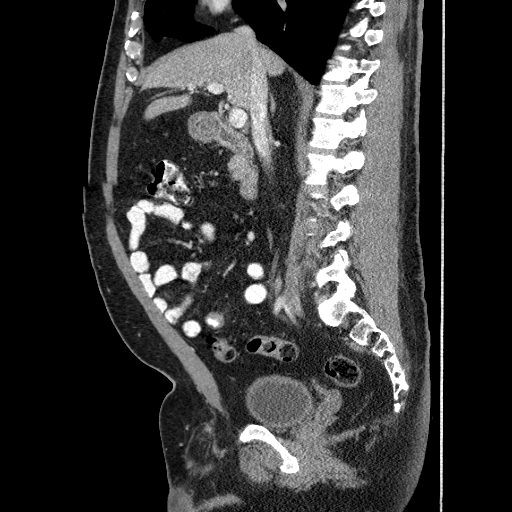
[im 95/169  soft-tissue]
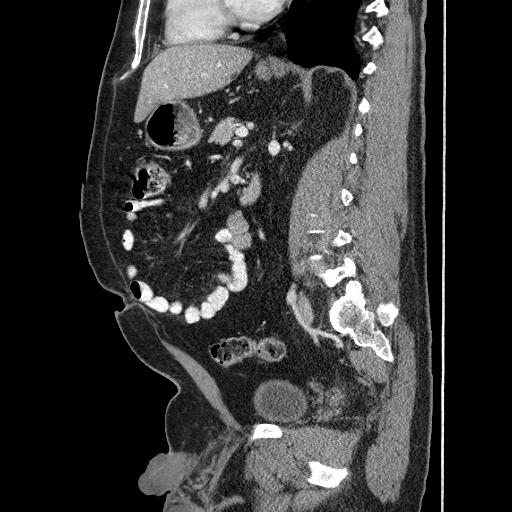

[13 of 36 positions shown; findings below may reference images not displayed]

FINDINGS: Lower chest: Lung bases are free of acute infiltrate or sizable
effusion.

Hepatobiliary: Diffuse fatty infiltration of the liver is noted. The
gallbladder is within normal limits.

Pancreas: Unremarkable. No pancreatic ductal dilatation or
surrounding inflammatory changes.

Spleen: Normal in size without focal abnormality.

Adrenals/Urinary Tract: The adrenal glands are within normal limits.
The kidneys are well visualized bilaterally without renal calculi or
urinary tract obstructive change bladder is partially distended.

Stomach/Bowel: Mild diverticular changes noted without evidence of
diverticulitis. The appendix is not well visualized although no
inflammatory changes to suggest appendicitis are seen. No
obstructive or inflammatory changes are noted.

Vascular/Lymphatic: No significant vascular findings are present. No
enlarged abdominal or pelvic lymph nodes.

Reproductive: Prostate is unremarkable.

Other: Small fat containing inguinal hernias are noted bilaterally.
The left inguinal hernia contains a knuckle of colon although no
true herniation is seen. This may contribute to the patient's
underlying clinical symptomatology.

Musculoskeletal: No acute or significant osseous findings.
IMPRESSION: Fat containing inguinal hernias as described with a single wall of
sigmoid colon extending into the left inguinal hernia. This may
contribute to the patient's symptomatology.

Chronic changes as described above.
